# Patient Record
Sex: Female | Born: 2005
Health system: Southern US, Community
[De-identification: ages and names within clinical notes are randomized; demographics above are authoritative.]

## PROBLEM LIST (undated history)

## (undated) DIAGNOSIS — H539 Unspecified visual disturbance: Secondary | ICD-10-CM

## (undated) DIAGNOSIS — F419 Anxiety disorder, unspecified: Secondary | ICD-10-CM

## (undated) DIAGNOSIS — F909 Attention-deficit hyperactivity disorder, unspecified type: Secondary | ICD-10-CM

## (undated) DIAGNOSIS — G4700 Insomnia, unspecified: Secondary | ICD-10-CM

## (undated) DIAGNOSIS — F329 Major depressive disorder, single episode, unspecified: Secondary | ICD-10-CM

## (undated) DIAGNOSIS — R519 Headache, unspecified: Secondary | ICD-10-CM

## (undated) DIAGNOSIS — F32A Depression, unspecified: Secondary | ICD-10-CM

## (undated) HISTORY — PX: ESOPHAGOGASTRODUODENOSCOPY: SHX1529

---
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
## 2024-01-25 NOTE — Progress Notes (Signed)
 Discussed with patient during visit by treating Jaizon Deroos

## 2024-06-03 ENCOUNTER — Encounter: Payer: Self-pay | Admitting: Advanced Practice Midwife

## 2024-06-03 ENCOUNTER — Other Ambulatory Visit (HOSPITAL_COMMUNITY)
Admission: RE | Admit: 2024-06-03 | Discharge: 2024-06-03 | Disposition: A | Discharge status: Home / Self Care | Payer: MEDICAID | Source: Ambulatory Visit | Attending: Advanced Practice Midwife | Admitting: Advanced Practice Midwife

## 2024-06-03 ENCOUNTER — Ambulatory Visit: Payer: MEDICAID | Admitting: Advanced Practice Midwife

## 2024-06-03 VITALS — BP 101/60 | HR 78 | Ht 69.5 in | Wt 143.0 lb

## 2024-06-03 DIAGNOSIS — Z113 Encounter for screening for infections with a predominantly sexual mode of transmission: Secondary | ICD-10-CM

## 2024-06-03 DIAGNOSIS — N946 Dysmenorrhea, unspecified: Secondary | ICD-10-CM

## 2024-06-03 DIAGNOSIS — Z3009 Encounter for other general counseling and advice on contraception: Secondary | ICD-10-CM

## 2024-06-03 DIAGNOSIS — Z01419 Encounter for gynecological examination (general) (routine) without abnormal findings: Secondary | ICD-10-CM | POA: Diagnosis present

## 2024-06-03 LAB — POCT URINE PREGNANCY: Preg Test, Ur: NEGATIVE

## 2024-06-03 MED ORDER — NORETHIN ACE-ETH ESTRAD-FE 1-20 MG-MCG PO TABS
1.0000 | ORAL_TABLET | Freq: Every day | ORAL | 4 refills | Status: DC
Start: 2024-06-03 — End: 2024-07-09

## 2024-06-03 NOTE — Progress Notes (Signed)
   GYNECOLOGY PROGRESS NOTE  History:  18 y.o. G0P0000 presents to [facility] office today for routine gyn visit. She reports painful periods that were better when she was on OCPs.  She is sexually active and uses condoms.  She denies h/a, dizziness, shortness of breath, n/v, or fever/chills.    The following portions of the patient's history were reviewed and updated as appropriate: allergies, current medications, past family history, past medical history, past social history, past surgical history and problem list.   Health Maintenance Due  Topic Date Due   DTaP/Tdap/Td (6 - Tdap) 02/09/2016   HPV VACCINES (1 - 3-dose series) Never done   HIV Screening  Never done   Meningococcal B Vaccine (1 of 2 - Standard) Never done   Hepatitis C Screening  Never done   CHLAMYDIA SCREENING  03/16/2024   Influenza Vaccine  04/19/2024   COVID-19 Vaccine (1 - 2024-25 season) Never done     Review of Systems:  Pertinent items are noted in HPI.   Objective:  Physical Exam Blood pressure 101/60, pulse 78, height 5' 9.5 (1.765 m), weight 143 lb (64.9 kg), last menstrual period 05/09/2024. VS reviewed, nursing note reviewed,  Constitutional: well developed, well nourished, no distress HEENT: normocephalic CV: normal rate Pulm/chest wall: normal effort Breast Exam: deferred Abdomen: soft Neuro: alert and oriented x 3 Skin: warm, dry Psych: affect normal Pelvic exam: deferred  Assessment & Plan:  1. Encounter for annual routine gynecological examination (Primary)  - POCT urine pregnancy   2. Encounter for counseling regarding contraception --Discussed pt contraceptive plans and reviewed contraceptive methods based on pt preferences and effectiveness.  Pt prefers to restart low dose OCPs. - norethindrone-ethinyl estradiol-FE (JUNEL FE 1/20) 1-20 MG-MCG tablet; Take 1 tablet by mouth daily.  Dispense: 84 tablet; Refill: 4  3. Dysmenorrhea in adolescent  - norethindrone-ethinyl  estradiol-FE (JUNEL FE 1/20) 1-20 MG-MCG tablet; Take 1 tablet by mouth daily.  Dispense: 84 tablet; Refill: 4  4. Routine screening for STI (sexually transmitted infection)  - Cervicovaginal ancillary only( [place])   No follow-ups on file.   Olam Boards, CNM 9:09 PM

## 2024-06-03 NOTE — Progress Notes (Signed)
 Wants STI testing with swab only. Denies concerns. Wants to start OC option birth control. Self swab done. PHQ-6  GAD-6

## 2024-06-05 LAB — CERVICOVAGINAL ANCILLARY ONLY
Bacterial Vaginitis (gardnerella): NEGATIVE
Chlamydia: NEGATIVE
Comment: NEGATIVE
Comment: NEGATIVE
Comment: NEGATIVE
Comment: NORMAL
Neisseria Gonorrhea: NEGATIVE
Trichomonas: NEGATIVE

## 2024-06-19 ENCOUNTER — Encounter (INDEPENDENT_AMBULATORY_CARE_PROVIDER_SITE_OTHER): Payer: Self-pay

## 2024-06-20 ENCOUNTER — Encounter (INDEPENDENT_AMBULATORY_CARE_PROVIDER_SITE_OTHER): Payer: Self-pay

## 2024-06-23 ENCOUNTER — Encounter: Payer: Self-pay | Admitting: Advanced Practice Midwife

## 2024-07-09 ENCOUNTER — Other Ambulatory Visit: Payer: Self-pay | Admitting: Advanced Practice Midwife

## 2024-07-09 DIAGNOSIS — N921 Excessive and frequent menstruation with irregular cycle: Secondary | ICD-10-CM

## 2024-07-09 MED ORDER — NORETHIN ACE-ETH ESTRAD-FE 1.5-30 MG-MCG PO TABS: 1.0000 | ORAL_TABLET | Freq: Every day | ORAL | 11 refills | Status: AC

## 2024-07-09 MED ORDER — NORETHINDRONE ACET-ETHINYL EST 1.5-30 MG-MCG PO TABS
1.0000 | ORAL_TABLET | Freq: Every day | ORAL | 11 refills | Status: DC
Start: 2024-07-09 — End: 2024-07-09

## 2024-07-09 NOTE — Progress Notes (Signed)
 Pt reports irregular bleeding persists despite taking low dose OCPs.  Rx sent for OCPs with 30 mcg estrogen. Pt switched from Junel 1/20 to [place]iley or Junel 1.5/30.  Pt to f/u if bleeding persists.
